# Patient Record
Sex: Male | Born: 1995
Health system: Southern US, Community
[De-identification: ages and names within clinical notes are randomized; demographics above are authoritative.]

## PROBLEM LIST (undated history)

## (undated) DIAGNOSIS — S6990XA Unspecified injury of unspecified wrist, hand and finger(s), initial encounter: Secondary | ICD-10-CM

## (undated) HISTORY — PX: TONSILLECTOMY: SUR1361

## (undated) HISTORY — PX: ADENOIDECTOMY: SUR15

## (undated) HISTORY — DX: Unspecified injury of unspecified wrist, hand and finger(s), initial encounter: S69.90XA

---
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
## 2017-01-06 DIAGNOSIS — R079 Chest pain, unspecified: Secondary | ICD-10-CM | POA: Diagnosis not present

## 2017-01-13 DIAGNOSIS — R079 Chest pain, unspecified: Secondary | ICD-10-CM | POA: Diagnosis not present

## 2017-01-13 DIAGNOSIS — R0602 Shortness of breath: Secondary | ICD-10-CM | POA: Diagnosis not present

## 2017-01-17 DIAGNOSIS — R079 Chest pain, unspecified: Secondary | ICD-10-CM | POA: Diagnosis not present

## 2017-01-23 DIAGNOSIS — R509 Fever, unspecified: Secondary | ICD-10-CM | POA: Diagnosis not present

## 2017-07-17 DIAGNOSIS — S058X2A Other injuries of left eye and orbit, initial encounter: Secondary | ICD-10-CM | POA: Diagnosis not present

## 2018-01-01 ENCOUNTER — Encounter: Payer: Self-pay | Admitting: Family Medicine

## 2018-01-01 ENCOUNTER — Ambulatory Visit: Payer: 59 | Admitting: Family Medicine

## 2018-01-01 VITALS — BP 110/70 | HR 110 | Temp 97.9°F | Wt 209.7 lb

## 2018-01-01 DIAGNOSIS — L089 Local infection of the skin and subcutaneous tissue, unspecified: Secondary | ICD-10-CM

## 2018-01-01 MED ORDER — CEPHALEXIN 500 MG PO CAPS: 500.0000 mg | ORAL_CAPSULE | Freq: Three times a day (TID) | ORAL | 0 refills | Status: DC

## 2018-01-01 NOTE — Progress Notes (Signed)
Subjective:     Patient ID: Douglas Fry, male   DOB: 1996-05-14, 22 y.o.   MRN: [id number]  HPI Patient seen with some erythema involving the left fourth toe and right second and third toes. He first noticed about 3 weeks ago. He graduated from [employer] last year and ran track there but has not been running recently. He is doing some weight workouts but no repetitive lower extremity exercises. He has symptoms of slight warmth and pain with the above-mentioned toes. No recent change of shoe. No fevers or chills. Trimmed nails recently but erythema preceded that.  Past Medical History:  Diagnosis Date  . Thumb injury    left   Past Surgical History:  Procedure Laterality Date  . ADENOIDECTOMY    . TONSILLECTOMY      reports that  has never smoked. he has never used smokeless tobacco. His alcohol and drug histories are not on file. family history is not on file. No Known Allergies   Review of Systems  Constitutional: Negative for chills and fever.  Respiratory: Negative for shortness of breath.   Cardiovascular: Negative for chest pain.  Neurological: Negative for weakness.  Hematological: Negative for adenopathy. Does not bruise/bleed easily.       Objective:   Physical Exam  Constitutional: He appears well-developed and well-nourished.  Cardiovascular: Normal rate and regular rhythm.  Pulmonary/Chest: Effort normal and breath sounds normal. No respiratory distress. He has no wheezes. He has no rales.  Skin:  He has some mild erythema left fourth and right second and third toes just proximal to the nail. No evidence for ingrown nail. No granulation tissue. These toes are slightly warm to touch and erythematous. No pustules. No fluctuance. Minimally tender. No erythema involving the foot.  Non-scaly and no vesicles.  No interdigital involvement       Assessment:     Patient has some inflammatory changes involving the left fourth, and right second and  third toes. Etiology unclear.  No vesicles.    Plan:     -We decided to go in cover with Keflex 500 mg 3 times a day for 10 days given possible early cellulitis changes above. -Follow-up for any progressive or persistent erythema or any new symptoms -Avoid tight fitting shoes  Kristian CoveyBruce W Lindalou Soltis MD [facility]

## 2018-01-01 NOTE — Patient Instructions (Signed)
Touch base in 2 weeks if not improved. Avoid tight fitting shoes.

## 2018-01-23 ENCOUNTER — Telehealth: Payer: Self-pay | Admitting: Family Medicine

## 2018-01-23 MED ORDER — CEPHALEXIN 500 MG PO CAPS: 500.0000 mg | ORAL_CAPSULE | Freq: Three times a day (TID) | ORAL | 0 refills | Status: AC

## 2018-01-23 NOTE — Telephone Encounter (Signed)
Copied from CRM [id number]. Topic: Quick Communication - See Telephone Encounter >> Jan 23, 2018 11:05 AM Windy KalataMichael, Sameen Leas L, NT wrote: CRM for notification. See Telephone encounter for:  01/23/18.  Pt was seen on 01/01/18 for toe infection. Was informed if Cephalexin did not clear it up to call back to get another script. Patient is calling and states it has not cleared up completley. [facility].

## 2018-01-23 NOTE — Telephone Encounter (Signed)
Patient states that" it was almost completely gone when he took his last pill, but then it came back".

## 2018-01-23 NOTE — Telephone Encounter (Signed)
Left detailed message on machine for patient Rx sent.

## 2018-01-23 NOTE — Telephone Encounter (Signed)
Did he see some improvement?

## 2018-01-23 NOTE — Telephone Encounter (Signed)
Refill Keflex once more

## 2018-02-12 ENCOUNTER — Encounter: Payer: Self-pay | Admitting: Podiatry

## 2018-02-12 ENCOUNTER — Ambulatory Visit: Payer: 59 | Admitting: Podiatry

## 2018-02-12 VITALS — BP 114/69 | HR 68 | Ht >= 80 in | Wt 211.0 lb

## 2018-02-12 DIAGNOSIS — M7752 Other enthesopathy of left foot: Secondary | ICD-10-CM

## 2018-02-12 MED ORDER — DICLOFENAC SODIUM 75 MG PO TBEC: 75.0000 mg | DELAYED_RELEASE_TABLET | Freq: Two times a day (BID) | ORAL | 0 refills | Status: AC

## 2018-02-12 NOTE — Patient Instructions (Signed)
Place 1/4 cup of epsom salts in a quart of warm tap water.  Submerge your foot or feet in the solution and soak for 20 minutes.  This soak should be done twice a day.  Next, remove your foot or feet from solution, blot dry the affected area. Apply ointment and cover if instructed by your doctor.   IF YOUR SKIN BECOMES IRRITATED WHILE USING THESE INSTRUCTIONS, IT IS OKAY TO SWITCH TO  WHITE VINEGAR AND WATER.  As another alternative soak, you may use antibacterial soap and water.  Monitor for any signs/symptoms of infection. Call the office immediately if any occur or go directly to the emergency room. Call with any questions/concerns.  Ingrown Toenail An ingrown toenail occurs when the corner or sides of your toenail grow into the surrounding skin. The big toe is most commonly affected, but it can happen to any of your toes. If your ingrown toenail is not treated, you will be at risk for infection. What are the causes? This condition may be caused by:  Wearing shoes that are too small or tight.  Injury or trauma, such as stubbing your toe or having your toe stepped on.  Improper cutting or care of your toenails.  Being born with (congenital) nail or foot abnormalities, such as having a nail that is too big for your toe.  What increases the risk? Risk factors for an ingrown toenail include:  Age. Your nails tend to thicken as you get older, so ingrown nails are more common in older people.  Diabetes.  Cutting your toenails incorrectly.  Blood circulation problems.  What are the signs or symptoms? Symptoms may include:  Pain, soreness, or tenderness.  Redness.  Swelling.  Hardening of the skin surrounding the toe.  Your ingrown toenail may be infected if there is fluid, pus, or drainage. How is this diagnosed? An ingrown toenail may be diagnosed by medical history and physical exam. If your toenail is infected, your health care provider may test a sample of the  drainage. How is this treated? Treatment depends on the severity of your ingrown toenail. Some ingrown toenails may be treated at home. More severe or infected ingrown toenails may require surgery to remove all or part of the nail. Infected ingrown toenails may also be treated with antibiotic medicines. Follow these instructions at home:  If you were prescribed an antibiotic medicine, finish all of it even if you start to feel better.  Soak your foot in warm soapy water for 20 minutes, 3 times per day or as directed by your health care provider.  Carefully lift the edge of the nail away from the sore skin by wedging a small piece of cotton under the corner of the nail. This may help with the pain. Be careful not to cause more injury to the area.  Wear shoes that fit well. If your ingrown toenail is causing you pain, try wearing sandals, if possible.  Trim your toenails regularly and carefully. Do not cut them in a curved shape. Cut your toenails straight across. This prevents injury to the skin at the corners of the toenail.  Keep your feet clean and dry.  If you are having trouble walking and are given crutches by your health care provider, use them as directed.  Do not pick at your toenail or try to remove it yourself.  Take medicines only as directed by your health care provider.  Keep all follow-up visits as directed by your health care provider. This   is important. Contact a health care provider if:  Your symptoms do not improve with treatment. Get help right away if:  You have red streaks that start at your foot and go up your leg.  You have a fever.  You have increased redness, swelling, or pain.  You have fluid, blood, or pus coming from your toenail. This information is not intended to replace advice given to you by your health care provider. Make sure you discuss any questions you have with your health care provider. Document Released: 12/02/2000 Document Revised:  05/06/2016 Document Reviewed: 10/29/2014 Elsevier Interactive Patient Education  2018 Elsevier Inc.  

## 2018-02-14 NOTE — Progress Notes (Signed)
   HPI: 22 year old male presenting today for pain to the 1st, 2nd and 3rd digits of the left foot. He reports associated swelling of the toes. He has not done anything to treat the symptoms. There are no modifying factors noted. He denies any trauma. Patient is here for further evaluation and treatment.   Past Medical History:  Diagnosis Date  . Thumb injury    left     Physical Exam: General: The patient is alert and oriented x3 in no acute distress.  Dermatology: Skin is warm, dry and supple bilateral lower extremities. Negative for open lesions or macerations.  Vascular: Palpable pedal pulses bilaterally. No edema or erythema noted. Capillary refill within normal limits.  Neurological: Epicritic and protective threshold grossly intact bilaterally.   Musculoskeletal Exam: Pain on palpation to the interphalangeal joints of the 1st and 2nd toes of the left foot. Range of motion within normal limits to all pedal and ankle joints bilateral. Muscle strength 5/5 in all groups bilateral.    Assessment: - toe capsulitis great toe and 2nd toe of the left foot   Plan of Care:  - Patient evaluated.  - Prescription for diclofenac provided to patient.  - Recommended good shoe gear.  - Return to clinic as needed.    Felecia ShellingBrent M. Evans, DPM [facility]  Dr. Felecia ShellingBrent M. Evans, DPM    [address]                                        , [place] [id number]                Office [phone]  Fax [phone]

## 2019-01-08 DIAGNOSIS — B356 Tinea cruris: Secondary | ICD-10-CM | POA: Diagnosis not present

## 2019-01-18 DIAGNOSIS — B356 Tinea cruris: Secondary | ICD-10-CM | POA: Diagnosis not present

## 2019-01-18 DIAGNOSIS — R21 Rash and other nonspecific skin eruption: Secondary | ICD-10-CM | POA: Diagnosis not present

## 2019-01-18 DIAGNOSIS — R399 Unspecified symptoms and signs involving the genitourinary system: Secondary | ICD-10-CM | POA: Diagnosis not present
